# Patient Record
Sex: Female | Born: 2000 | Race: White | Hispanic: No | Marital: Single
Health system: Southern US, Community
[De-identification: ages and names within clinical notes are randomized; demographics above are authoritative.]

---
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
## 2022-01-27 ENCOUNTER — Telehealth: Payer: No Typology Code available for payment source | Admitting: Child and Adolescent Psychiatry

## 2022-01-27 ENCOUNTER — Telehealth: Payer: Self-pay | Admitting: Child and Adolescent Psychiatry

## 2022-01-27 NOTE — Telephone Encounter (Signed)
Pt was sent link via text and email to connect on video for telemedicine encounter for scheduled appointment, and was also followed up with phone call. Pt did not connect on the video, and writer left the VM requesting to connect on the video or call back to reschedule appointment if they are not able to connect today for appointment.   

## 2022-01-30 ENCOUNTER — Telehealth: Payer: No Typology Code available for payment source | Admitting: Child and Adolescent Psychiatry

## 2022-01-30 ENCOUNTER — Other Ambulatory Visit: Payer: Self-pay

## 2022-01-30 ENCOUNTER — Telehealth: Payer: Self-pay | Admitting: Child and Adolescent Psychiatry

## 2022-01-30 NOTE — Telephone Encounter (Signed)
Pt was sent link via text and email to connect on video for telemedicine encounter for scheduled appointment, and was also followed up with phone call. Pt did not connect on the video, and writer left the VM requesting to connect on the video or call back to reschedule appointment if they are not able to connect today for appointment.   

## 2022-01-31 ENCOUNTER — Telehealth (HOSPITAL_COMMUNITY): Payer: Self-pay | Admitting: Child and Adolescent Psychiatry

## 2022-01-31 ENCOUNTER — Encounter: Payer: Self-pay | Admitting: Child and Adolescent Psychiatry

## 2022-01-31 ENCOUNTER — Telehealth (INDEPENDENT_AMBULATORY_CARE_PROVIDER_SITE_OTHER): Payer: No Typology Code available for payment source | Admitting: Child and Adolescent Psychiatry

## 2022-01-31 ENCOUNTER — Other Ambulatory Visit: Payer: Self-pay

## 2022-01-31 DIAGNOSIS — F418 Other specified anxiety disorders: Secondary | ICD-10-CM | POA: Diagnosis not present

## 2022-01-31 MED ORDER — ESCITALOPRAM OXALATE 5 MG PO TABS: 7.5000 mg | ORAL_TABLET | Freq: Every day | ORAL | 2 refills | Status: DC

## 2022-01-31 NOTE — Progress Notes (Signed)
?Virtual Visit via Video Note ? ?I connected with Ellen Byrd on 01/31/22  at  1:30 PM EDT by a video enabled telemedicine application and verified that I am speaking with the correct person using two identifiers. ? ?Location: ?Patient: home ?Provider: office ?  ?I discussed the limitations of evaluation and management by telemedicine and the availability of in person appointments. The patient expressed understanding and agreed to proceed. ? ?  ?I discussed the assessment and treatment plan with the patient. The patient was provided an opportunity to ask questions and all were answered. The patient agreed with the plan and demonstrated an understanding of the instructions. ?  ?The patient was advised to call back or seek an in-person evaluation if the symptoms worsen or if the condition fails to improve as anticipated. ? ? ? ?Ellen SmallingHiren M Yalexa Blust, MD ? ? ?[facility] MD/PA/NP OP Progress Note ?01/31/22  1:30PM ?Ellen Byrd  ?MRN:  [id number] ? ?Chief Complaint: Follow-up for anxiety. ? ?HPI: This is an 21 year old female, domiciled in between parents, freshman at [employer] with no significant medical history and psychiatric history of anxiety was seen and evaluated over telemedicine encounter for follow-up.  ? ?[name] reports that she has noticed increasing anxiety in the context of finding out that she may need another surgery on her knee to remove a bony spur that is causing pain in her knee.  She reports that because of her surgery last year, she underwent a lot of trauma/anxiety/pain related to surgery and does not want to do the surgery again however she also does not want to stay with the pain as well.  She reports that she has an appointment with her orthopedics doctor on Friday and will find out if they will recommend surgery or she should just continue with brace.  Provided refelctive and empathic listening, and validated patient's experience. Allowed her to explore her feelings related to surgery and validated  them. Discussed that overthinking about the unknown will result in more anxiety and she does not have control over it. Explored the ways to stay at present and try grounding techinques. She reports that she usually distracts her self and that helps.  I also offered her to increase the dose of Lexapro to 10 mg once a day however she would like to wait until the appointment.  Discussed that she seems to be going through an adjustment reaction and if her anxiety remains high than we can temporarily increase the dose of her Lexapro until her surgery and once she completes her surgery and her anxiety improves then we can reduce the dose back to 7.5 mg.  She reports that she will think about this and will let me know. ? ?She reports occasional sadness in the context of recent stressors, denies any persistent sad mood or anhedonia.  She denies any SI/HI.  She reports that she has been eating well, doing well academically, and on the track to finish her college graduation 1-1/2-year prior to an actual graduation date. ? ?We discussed to continue with current medications for now.  She will follow back again in 4 weeks or earlier if needed. ? ?Visit Diagnosis:  ?  ICD-10-CM   ?1. Other specified anxiety disorders  F41.8 escitalopram (LEXAPRO) 5 MG tablet  ?  ? ? ? ? ?Past Psychiatric History:  ? ?  ?Inpatient: None ?RTC: None ?Outpatient:  ?   - Meds: None ?   - Therapy: Ellen EaringAllen Byrd since 6th grade and was seeing every 2  weeks. Therapist recently retired and [name] does not feel the need to continue with therapy.  ?Hx of SI/HI: None reported. ? ?Past Medical History: No past medical history on file. No past surgical history on file. ? ?Family Psychiatric History: ? ?  ?Mother - Anxiety ?No other family psychiatric hx reported.  ? ?Family History: No family history on file. ? ?Social History:  ?Social History  ? ?Socioeconomic History  ? Marital status: Single  ?  Spouse name: Not on file  ? Number of children: Not on file  ?  Years of education: Not on file  ? Highest education level: Not on file  ?Occupational History  ? Not on file  ?Tobacco Use  ? Smoking status: Not on file  ? Smokeless tobacco: Not on file  ?Substance and Sexual Activity  ? Alcohol use: Not on file  ? Drug use: Not on file  ? Sexual activity: Not on file  ?Other Topics Concern  ? Not on file  ?Social History Narrative  ? Not on file  ? ?Social Determinants of Health  ? ?Financial Resource Strain: Not on file  ?Food Insecurity: Not on file  ?Transportation Needs: Not on file  ?Physical Activity: Not on file  ?Stress: Not on file  ?Social Connections: Not on file  ? ? ?Allergies: No Known Allergies ? ?Metabolic Disorder Labs: ?No results found for: HGBA1C, MPG ?No results found for: PROLACTIN ?No results found for: CHOL, TRIG, HDL, CHOLHDL, VLDL, LDLCALC ?No results found for: TSH ? ?Therapeutic Level Labs: ?No results found for: LITHIUM ?No results found for: VALPROATE ?No components found for:  CBMZ ? ?Current Medications: ?Current Outpatient Medications  ?Medication Sig Dispense Refill  ? escitalopram (LEXAPRO) 5 MG tablet Take 1.5 tablets (7.5 mg total) by mouth daily. 45 tablet 2  ? hydrOXYzine (ATARAX/VISTARIL) 25 MG tablet Take 0.5-1 tablets (12.5-25 mg total) by mouth 3 (three) times daily as needed for anxiety. 30 tablet 0  ? ?No current facility-administered medications for this visit.  ? ? ? ?Musculoskeletal: ?Strength & Muscle Tone: unable to assess since visit was over the telemedicine. ?Gait & Station: unable to assess since visit was over the telemedicine. ?Patient leans: N/A ? ?Psychiatric Specialty Exam: ?Review of Systems  ?There were no vitals taken for this visit.There is no height or weight on file to calculate BMI.  ? ? ?Mental Status Exam: ?Appearance: casually dressed; well groomed; no overt signs of trauma or distress noted ?Attitude: calm, cooperative with good eye contact ?Activity: No PMA/PMR, no tics/no tremors; no EPS noted  ?Speech:  normal rate, rhythm and volume ?Thought Process: Logical, linear, and goal-directed.  ?Associations: no looseness, tangentiality, circumstantiality, flight of ideas, thought blocking or word salad noted ?Thought Content: (abnormal/psychotic thoughts): no abnormal or delusional thought process evidenced ?SI/HI: denies Si/Hi ?Perception: no illusions or visual/auditory hallucinations noted; no response to internal stimuli demonstrated ?Mood & Affect: "ok"/full range,restricted ?Judgment & Insight: both fair ?Attention and Concentration : Good ?Cognition : WNL ?Language : Good ?ADL - Intact  ? ?Screenings: ? ? ?Assessment and Plan:  ? 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 Her anxiety appears to have worsened in the context of new stressor. She would like to continue with current medications. Will continue to monitor, and asked front dest to make a therapy appointment to [facility] office.  ? ? ?  ?Plan: ?  ?- Reviewed response to current medications ?- Continue  Lexapro 7.5 mg daily, pt declines to increase at this time.  ?- Continue atarax 12.5-25 mg q8hrs as needed for anxiety ?- Side effects including but not limited to nausea, vomiting, diarrhea, constipation, headaches, dizziness, black box warning of suicidal thoughts with SSRI were discussed with pt and parents at the initiation.  ? ?Supportive counseling provided as mentioned in HPI, Duration - 20 minutes.  ? ? ? ? ? ?Ellen Smalling, MD ?01/31/22, 10:00AM ?

## 2022-03-07 ENCOUNTER — Telehealth (INDEPENDENT_AMBULATORY_CARE_PROVIDER_SITE_OTHER): Payer: No Typology Code available for payment source | Admitting: Child and Adolescent Psychiatry

## 2022-03-07 DIAGNOSIS — F411 Generalized anxiety disorder: Secondary | ICD-10-CM

## 2022-03-07 NOTE — Progress Notes (Signed)
?Virtual Visit via Video Note ? ?I connected with Ellen Byrd on 03/07/22  at  1:30 PM EDT by a video enabled telemedicine application and verified that I am speaking with the correct person using two identifiers. ? ?Location: ?Patient: home ?Provider: office ?  ?I discussed the limitations of evaluation and management by telemedicine and the availability of in person appointments. The patient expressed understanding and agreed to proceed. ? ?  ?I discussed the assessment and treatment plan with the patient. The patient was provided an opportunity to ask questions and all were answered. The patient agreed with the plan and demonstrated an understanding of the instructions. ?  ?The patient was advised to call back or seek an in-person evaluation if the symptoms worsen or if the condition fails to improve as anticipated. ? ? ? ?Ellen Smalling, MD ? ? ?BH MD/PA/NP OP Progress Note ?03/07/22  1:30PM ?Ellen Byrd  ?MRN:  [id number] ? ?Chief Complaint: Follow-up for anxiety. ? ?HPI: This is an 21 year old female, domiciled in between parents, freshman at [employer] with no significant medical history and psychiatric history of anxiety was seen and evaluated over telemedicine encounter for follow-up.  ? ?[name] reports that she is doing "okay".  She reports that she continues to have multiple psychosocial stressors including finals next week, moving to a different home near her college, her father moving to [place], mother with her own problems and threat of 2nd surgery on her knee.  She reports that despite the stressors she is trying to manage her anxiety.  Provided refelctive and empathic listening, and validated patient's experience. Supportive counseling was provided. She reports that she is doing okay managing it.  She reports that despite all the stress that she has been sleeping very well and sleep has been restful.  She reports that her anxiety seems manageable and would like to continue with current  medications. ? ?We discussed to continue with current medications for now.  She will follow back again in 5-6weeks or earlier if needed. ? ?Visit Diagnosis:  ?  ICD-10-CM   ?1. Generalized anxiety disorder  F41.1   ?  ? ? ? ? ?Past Psychiatric History:  ? ?  ?Inpatient: None ?RTC: None ?Outpatient:  ?   - Meds: None ?   - Therapy: Ellen Byrd since 6th grade and was seeing every 2 weeks. Therapist recently retired and Rowan does not feel the need to continue with therapy.  ?Hx of SI/HI: None reported. ? ?Past Medical History: No past medical history on file. No past surgical history on file. ? ?Family Psychiatric History: ? ?  ?Mother - Anxiety ?No other family psychiatric hx reported.  ? ?Family History: No family history on file. ? ?Social History:  ?Social History  ? ?Socioeconomic History  ? Marital status: Single  ?  Spouse name: Not on file  ? Number of children: Not on file  ? Years of education: Not on file  ? Highest education level: Not on file  ?Occupational History  ? Not on file  ?Tobacco Use  ? Smoking status: Not on file  ? Smokeless tobacco: Not on file  ?Substance and Sexual Activity  ? Alcohol use: Not on file  ? Drug use: Not on file  ? Sexual activity: Not on file  ?Other Topics Concern  ? Not on file  ?Social History Narrative  ? Not on file  ? ?Social Determinants of Health  ? ?Financial Resource Strain: Not on file  ?Food Insecurity: Not  on file  ?Transportation Needs: Not on file  ?Physical Activity: Not on file  ?Stress: Not on file  ?Social Connections: Not on file  ? ? ?Allergies: No Known Allergies ? ?Metabolic Disorder Labs: ?No results found for: HGBA1C, MPG ?No results found for: PROLACTIN ?No results found for: CHOL, TRIG, HDL, CHOLHDL, VLDL, LDLCALC ?No results found for: TSH ? ?Therapeutic Level Labs: ?No results found for: LITHIUM ?No results found for: VALPROATE ?No components found for:  CBMZ ? ?Current Medications: ?Current Outpatient Medications  ?Medication Sig Dispense Refill   ? escitalopram (LEXAPRO) 5 MG tablet Take 1.5 tablets (7.5 mg total) by mouth daily. 45 tablet 2  ? hydrOXYzine (ATARAX/VISTARIL) 25 MG tablet Take 0.5-1 tablets (12.5-25 mg total) by mouth 3 (three) times daily as needed for anxiety. 30 tablet 0  ? ?No current facility-administered medications for this visit.  ? ? ? ?Musculoskeletal: ?Strength & Muscle Tone: unable to assess since visit was over the telemedicine. ?Gait & Station: unable to assess since visit was over the telemedicine. ?Patient leans: N/A ? ?Psychiatric Specialty Exam: ?Review of Systems  ?There were no vitals taken for this visit.There is no height or weight on file to calculate BMI.  ? ?Mental Status Exam: ?Appearance: casually dressed; well groomed; no overt signs of trauma or distress noted ?Attitude: calm, cooperative with good eye contact ?Activity: No PMA/PMR, no tics/no tremors; no EPS noted  ?Speech: normal rate, rhythm and volume ?Thought Process: Logical, linear, and goal-directed.  ?Associations: no looseness, tangentiality, circumstantiality, flight of ideas, thought blocking or word salad noted ?Thought Content: (abnormal/psychotic thoughts): no abnormal or delusional thought process evidenced ?SI/HI: denies Si/Hi ?Perception: no illusions or visual/auditory hallucinations noted; no response to internal stimuli demonstrated ?Mood & Affect: "good"/full range, neutral ?Judgment & Insight: both fair ?Attention and Concentration : Good ?Cognition : WNL ?Language : Good ?ADL - Intact  ? ?Screenings: ? ? ?Assessment and Plan:  ? 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 Her anxiety appears to be manageable despite multiple psychosocial stressors. She would like to continue with current medications. Will continue to monitor. ? ? ?  ?Plan: ?  ?- Reviewed response to current medications ?- Continue Lexapro 7.5 mg daily, pt declines to  increase at this time.  ?- Continue atarax 12.5-25 mg q8hrs as needed for anxiety ?- Side effects including but not limited to nausea, vomiting, diarrhea, constipation, headaches, dizziness, black box warning of suicidal thoughts with SSRI were discussed with pt and parents at the initiation.  ? ? ? ? ? ? ?Ellen Smalling, MD ?03/07/22, 10:00AM ?

## 2022-04-12 ENCOUNTER — Telehealth (INDEPENDENT_AMBULATORY_CARE_PROVIDER_SITE_OTHER): Payer: No Typology Code available for payment source | Admitting: Child and Adolescent Psychiatry

## 2022-04-12 DIAGNOSIS — F411 Generalized anxiety disorder: Secondary | ICD-10-CM

## 2022-04-12 MED ORDER — ESCITALOPRAM OXALATE 5 MG PO TABS: 7.5000 mg | ORAL_TABLET | Freq: Every day | ORAL | 2 refills | Status: DC

## 2022-04-12 NOTE — Progress Notes (Signed)
Virtual Visit via Video Note  I connected with Ellen Byrd on 04/12/22  at  1:30 PM EDT by a video enabled telemedicine application and verified that I am speaking with the correct person using two identifiers.  Location: Patient: home Provider: office   I discussed the limitations of evaluation and management by telemedicine and the availability of in person appointments. The patient expressed understanding and agreed to proceed.    I discussed the assessment and treatment plan with the patient. The patient was provided an opportunity to ask questions and all were answered. The patient agreed with the plan and demonstrated an understanding of the instructions.   The patient was advised to call back or seek an in-person evaluation if the symptoms worsen or if the condition fails to improve as anticipated.    Orlene Erm, MD   [facility] MD/PA/NP OP Progress Note 04/12/22  1:30PM Ellen Byrd  MRN:  [id number]  Chief Complaint: Follow-up for anxiety.  HPI: This is a 21 year old female, domiciled in between parents, Sophomore at [employer] with no significant medical history and psychiatric history of anxiety was seen and evaluated over telemedicine encounter for follow-up.   Ellen Byrd reports that she has started summer semester for her college.  She reports that she is doing overall "okay".  She is currently living with her brother, they have been renting a house in [place], [place].  She reports that school has been going okay, she has a lot of work to finish but she is doing okay with it.  She reports that she remains stressed, anxious, rates her anxiety at 4 out of 10, 10 being most anxious, anxiety intermittently increases, about 3 weeks ago she had a panic attack in the context of interaction with her father.  She reports that she eventually removed her cell from the situation, went to her room and was able to calm down.  She reports her mood is occasionally sad as well as some  anhedonia.  She denies problems with sleep, energy, appetite or concentration.  She denies any SI or HI.  She scored 6 on GAD-7 and 4 on PHQ-9.  She reports that she has been compliant to her medications and denies any problems with it.  She does report that occasionally she forgets to take them.  We discussed option of increasing Lexapro to 10 mg however she would like to continue with current dose for now.  We discussed therapy recommendations, and provided her with the name of website on which she can such a therapist for herself.  She verbalized understanding.  She will follow back again in about 1 month or earlier if needed.   Visit Diagnosis:    ICD-10-CM   1. Generalized anxiety disorder  F41.1 escitalopram (LEXAPRO) 5 MG tablet        Past Psychiatric History:     Inpatient: None RTC: None Outpatient:     - Meds: None    - Therapy: [facility] since 6th grade and was seeing every 2 weeks. Therapist recently retired and Meeker does not feel the need to continue with therapy.  Hx of SI/HI: None reported.  Past Medical History: No past medical history on file. No past surgical history on file.  Family Psychiatric History:    Mother - Anxiety No other family psychiatric hx reported.   Family History: No family history on file.  Social History:  Social History   Socioeconomic History   Marital status: Single    Spouse  name: Not on file   Number of children: Not on file   Years of education: Not on file   Highest education level: Not on file  Occupational History   Not on file  Tobacco Use   Smoking status: Not on file   Smokeless tobacco: Not on file  Substance and Sexual Activity   Alcohol use: Not on file   Drug use: Not on file   Sexual activity: Not on file  Other Topics Concern   Not on file  Social History Narrative   Not on file   Social Determinants of Health   Financial Resource Strain: Not on file  Food Insecurity: Not on file  Transportation  Needs: Not on file  Physical Activity: Not on file  Stress: Not on file  Social Connections: Not on file    Allergies: No Known Allergies  Metabolic Disorder Labs: No results found for: HGBA1C, MPG No results found for: PROLACTIN No results found for: CHOL, TRIG, HDL, CHOLHDL, VLDL, LDLCALC No results found for: TSH  Therapeutic Level Labs: No results found for: LITHIUM No results found for: VALPROATE No components found for:  CBMZ  Current Medications: Current Outpatient Medications  Medication Sig Dispense Refill   escitalopram (LEXAPRO) 5 MG tablet Take 1.5 tablets (7.5 mg total) by mouth daily. 45 tablet 2   hydrOXYzine (ATARAX/VISTARIL) 25 MG tablet Take 0.5-1 tablets (12.5-25 mg total) by mouth 3 (three) times daily as needed for anxiety. 30 tablet 0   No current facility-administered medications for this visit.     Musculoskeletal: Strength & Muscle Tone: unable to assess since visit was over the telemedicine. Gait & Station: unable to assess since visit was over the telemedicine. Patient leans: N/A  Psychiatric Specialty Exam: Review of Systems  There were no vitals taken for this visit.There is no height or weight on file to calculate BMI.   Mental Status Exam: Appearance: casually dressed; well groomed; no overt signs of trauma or distress noted Attitude: calm, cooperative with good eye contact Activity: No PMA/PMR, no tics/no tremors; no EPS noted  Speech: normal rate, rhythm and volume Thought Process: Logical, linear, and goal-directed.  Associations: no looseness, tangentiality, circumstantiality, flight of ideas, thought blocking or word salad noted Thought Content: (abnormal/psychotic thoughts): no abnormal or delusional thought process evidenced SI/HI: denies Si/Hi Perception: no illusions or visual/auditory hallucinations noted; no response to internal stimuli demonstrated Mood & Affect: "good"/full range, neutral Judgment & Insight: both  fair Attention and Concentration : Good Cognition : WNL Language : Good ADL - Intact   Screenings: GAD-7    Flowsheet Row Video Visit from 04/12/2022 in [facility]  Total GAD-7 Score 6      PHQ2-9    Flowsheet Row Video Visit from 04/12/2022 in [facility]  PHQ-2 Total Score 2  PHQ-9 Total Score 4        Assessment and Plan:   21 year old female with 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 Her anxiety appears to be manageable despite multiple psychosocial stressors. She would like to continue with current medications and does not feel the need to increase the dose. Recommended ind therapy, she will search for therapist on [email or web address]. Will continue to monitor.     Plan:   - Reviewed response to current medications - Continue Lexapro 7.5 mg daily, pt declines to increase at this time.  - Continue atarax 12.5-25 mg q8hrs as needed for anxiety -  Side effects including but not limited to nausea, vomiting, diarrhea, constipation, headaches, dizziness, black box warning of suicidal thoughts with SSRI were discussed with pt and parents at the initiation.   30 minutes total time for encounter today which included chart review, pt evaluation, supportive counseling, medication and other treatment discussions, medication orders and charting.           Orlene Erm, MD 04/12/22, 10:00AM

## 2022-05-17 ENCOUNTER — Telehealth (INDEPENDENT_AMBULATORY_CARE_PROVIDER_SITE_OTHER): Payer: No Typology Code available for payment source | Admitting: Child and Adolescent Psychiatry

## 2022-05-17 DIAGNOSIS — F418 Other specified anxiety disorders: Secondary | ICD-10-CM | POA: Diagnosis not present

## 2022-05-17 NOTE — Progress Notes (Signed)
Virtual Visit via Video Note  I connected with Ellen Byrd on 05/17/22  at  1:30 PM EDT by a video enabled telemedicine application and verified that I am speaking with the correct person using two identifiers.  Location: Patient: home Provider: office   I discussed the limitations of evaluation and management by telemedicine and the availability of in person appointments. The patient expressed understanding and agreed to proceed.    I discussed the assessment and treatment plan with the patient. The patient was provided an opportunity to ask questions and all were answered. The patient agreed with the plan and demonstrated an understanding of the instructions.   The patient was advised to call back or seek an in-person evaluation if the symptoms worsen or if the condition fails to improve as anticipated.    Hiren M Umrania, MD   BH MD/PA/NP OP Progress Note 05/17/22  1:30PM Ellen Byrd  MRN:  [id number]  Chief Complaint: Medication management follow-up for anxiety.  HPI: This is a 21-year-old female, domiciled in between parents, Sophomore at [employer] with no significant medical history and psychiatric history of anxiety was seen and evaluated over telemedicine encounter for follow-up.  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  She reports that it gives her decent money however it can be hard to work in the heat but she has been adjusting to it.  She also has finished taking one summer class and has 1 more before she can have a break.  She reports that she will have about 2 to 2-1/2 weeks of break before she starts the fall semester and plans to work during the break.  She reports that she has been reading books, watching TV in her free time and also spends a lot of time with her brother.  She was recently at her parents home for Fourth of July weekend and had a good time, also met her boyfriend who is studying emergency medicine technician  course at [employer].  She reports that her anxiety is manageable, her mood is sometimes up and down in the context of adjustment to work.  She has been sleeping well, eating well, denies any SI or HI.  She reports that she has stayed compliant with Lexapro and will call back if she feels the need to increase the dose.  We discussed to continue with current treatment and she would like to follow back again in a month.  Visit Diagnosis:    ICD-10-CM   1. Other specified anxiety disorders  F41.8         Past Psychiatric History:     Inpatient: None RTC: None Outpatient:     - Meds: None    - Therapy: [facility] since 6th grade and was seeing every 2 weeks. Therapist recently retired and Ellen Byrd does not feel the need to continue with therapy.  Hx of SI/HI: None reported.  Past Medical History: No past medical history on file. No past surgical history on file.  Family Psychiatric History:    Mother - Anxiety No other family psychiatric hx reported.   Family History: No family history on file.  Social History:  Social History   Socioeconomic History   Marital status: Single    Spouse name: Not on file   Number of children: Not on file   Years of education: Not on file   Highest education level: Not on file  Occupational History   Not   on file  Tobacco Use   Smoking status: Not on file   Smokeless tobacco: Not on file  Substance and Sexual Activity   Alcohol use: Not on file   Drug use: Not on file   Sexual activity: Not on file  Other Topics Concern   Not on file  Social History Narrative   Not on file   Social Determinants of Health   Financial Resource Strain: Not on file  Food Insecurity: Not on file  Transportation Needs: Not on file  Physical Activity: Not on file  Stress: Not on file  Social Connections: Not on file    Allergies: No Known Allergies  Metabolic Disorder Labs: No results found for: "HGBA1C", "MPG" No results found for:  "PROLACTIN" No results found for: "CHOL", "TRIG", "HDL", "CHOLHDL", "VLDL", "LDLCALC" No results found for: "TSH"  Therapeutic Level Labs: No results found for: "LITHIUM" No results found for: "VALPROATE" No results found for: "CBMZ"  Current Medications: Current Outpatient Medications  Medication Sig Dispense Refill   escitalopram (LEXAPRO) 5 MG tablet Take 1.5 tablets (7.5 mg total) by mouth daily. 45 tablet 2   hydrOXYzine (ATARAX/VISTARIL) 25 MG tablet Take 0.5-1 tablets (12.5-25 mg total) by mouth 3 (three) times daily as needed for anxiety. 30 tablet 0   No current facility-administered medications for this visit.     Musculoskeletal: Strength & Muscle Tone: unable to assess since visit was over the telemedicine. Gait & Station: unable to assess since visit was over the telemedicine. Patient leans: N/A  Psychiatric Specialty Exam: Review of Systems  There were no vitals taken for this visit.There is no height or weight on file to calculate BMI.   Mental Status Exam: Appearance: casually dressed; well groomed; no overt signs of trauma or distress noted Attitude: calm, cooperative with good eye contact Activity: No PMA/PMR, no tics/no tremors; no EPS noted  Speech: normal rate, rhythm and volume Thought Process: Logical, linear, and goal-directed.  Associations: no looseness, tangentiality, circumstantiality, flight of ideas, thought blocking or word salad noted Thought Content: (abnormal/psychotic thoughts): no abnormal or delusional thought process evidenced SI/HI: denies Si/Hi Perception: no illusions or visual/auditory hallucinations noted; no response to internal stimuli demonstrated Mood & Affect: "good"/full range, neutral Judgment & Insight: both fair Attention and Concentration : Good Cognition : WNL Language : Good ADL - Intact   Screenings: GAD-7    Flowsheet Row Video Visit from 04/12/2022 in [facility]  Total GAD-7 Score 6       PHQ2-9    Flowsheet Row Video Visit from 04/12/2022 in [facility]  PHQ-2 Total Score 2  PHQ-9 Total Score 4        Assessment and Plan:   21-year-old female with 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  No new concerns expressed today, continues to appear to manage her anxiety well and would like to continue with current medications.       Plan:   - Reviewed response to current medications - Continue Lexapro 7.5 mg daily, pt declines to increase at this time.  - Continue atarax 12.5-25 mg q8hrs as needed for anxiety - Side effects including but not limited to nausea, vomiting, diarrhea, constipation, headaches, dizziness, black box warning of suicidal thoughts with SSRI were discussed with pt and parents at the initiation.   20 minutes total time for encounter today which included chart review, pt evaluation, supportive counseling, medication and other treatment discussions, medication   orders and charting.           Hiren M Umrania, MD 05/17/22, 10:00AM 

## 2022-06-22 ENCOUNTER — Telehealth: Payer: Self-pay | Admitting: Child and Adolescent Psychiatry

## 2022-06-22 ENCOUNTER — Telehealth: Payer: No Typology Code available for payment source | Admitting: Child and Adolescent Psychiatry

## 2022-06-22 NOTE — Telephone Encounter (Addendum)
Pt was sent link via text and email to connect on video for telemedicine encounter for scheduled appointment, and was also followed up with phone call. Pt did not connect on the video, and writer left the VM requesting to connect on the video or call back to reschedule appointment if they are not able to connect today for appointment.   

## 2022-07-14 ENCOUNTER — Telehealth (INDEPENDENT_AMBULATORY_CARE_PROVIDER_SITE_OTHER): Payer: No Typology Code available for payment source | Admitting: Child and Adolescent Psychiatry

## 2022-07-14 DIAGNOSIS — F411 Generalized anxiety disorder: Secondary | ICD-10-CM

## 2022-07-14 MED ORDER — ESCITALOPRAM OXALATE 5 MG PO TABS: 7.5000 mg | ORAL_TABLET | Freq: Every day | ORAL | 2 refills | Status: DC

## 2022-07-14 NOTE — Progress Notes (Signed)
Virtual Visit via Video Note  I connected with Ellen Byrd on 07/14/22  at 10:00 AM EDT by a video enabled telemedicine application and verified that I am speaking with the correct person using two identifiers.  Location: Patient: home Provider: office   I discussed the limitations of evaluation and management by telemedicine and the availability of in person appointments. The patient expressed understanding and agreed to proceed.    I discussed the assessment and treatment plan with the patient. The patient was provided an opportunity to ask questions and all were answered. The patient agreed with the plan and demonstrated an understanding of the instructions.   The patient was advised to call back or seek an in-person evaluation if the symptoms worsen or if the condition fails to improve as anticipated.    Ellen Smalling, MD   [facility] MD/PA/NP OP Progress Note 07/14/22  1:30PM Ellen Byrd  MRN:  [id number]  Chief Complaint: Medication management follow-up for anxiety.  HPI: This is a 21 year old female, domiciled in between parents, Ellen Byrd with no significant medical history and psychiatric history of anxiety was seen and evaluated over telemedicine encounter for follow-up.  Ellen Byrd reports that she started her fall semester recently, continues to work about 32 hours a week and with all this she gets very tired.  She reports that she sleeps about 8-1/2 hours every day but despite that she has a lot of tiredness because of the work.  She reports that she has occasionally sad mood but denies any long periods of depressed mood and anxiety also situationally increases.  She reports that she is currently on a good dose on the medication and would like to continue with the same.  She reports that she has been eating well, denies any SI or HI.  She talked about her stressors of school, work. Provided refelctive and empathic listening, and validated patient's experience.  She  will follow back again in about 6 weeks or earlier if needed.  Visit Diagnosis:    ICD-10-CM   1. Generalized anxiety disorder  F41.1 escitalopram (LEXAPRO) 5 MG tablet        Past Psychiatric History:     Inpatient: None RTC: None Outpatient:     - Meds: None    - Therapy: Ellen Byrd since 6th grade and was seeing every 2 weeks. Therapist recently retired and Ellen Byrd does not feel the need to continue with therapy.  Hx of SI/HI: None reported.  Past Medical History: No past medical history on file. No past surgical history on file.  Family Psychiatric History:    Mother - Anxiety No other family psychiatric hx reported.   Family History: No family history on file.  Social History:  Social History   Socioeconomic History   Marital status: Single    Spouse name: Not on file   Number of children: Not on file   Years of education: Not on file   Highest education level: Not on file  Occupational History   Not on file  Tobacco Use   Smoking status: Not on file   Smokeless tobacco: Not on file  Substance and Sexual Activity   Alcohol use: Not on file   Drug use: Not on file   Sexual activity: Not on file  Other Topics Concern   Not on file  Social History Narrative   Not on file   Social Determinants of Health   Financial Resource Strain: Not on file  Food Insecurity: Not  on file  Transportation Needs: Not on file  Physical Activity: Not on file  Stress: Not on file  Social Connections: Not on file    Allergies: No Known Allergies  Metabolic Disorder Labs: No results found for: "HGBA1C", "MPG" No results found for: "PROLACTIN" No results found for: "CHOL", "TRIG", "HDL", "CHOLHDL", "VLDL", "LDLCALC" No results found for: "TSH"  Therapeutic Level Labs: No results found for: "LITHIUM" No results found for: "VALPROATE" No results found for: "CBMZ"  Current Medications: Current Outpatient Medications  Medication Sig Dispense Refill   escitalopram  (LEXAPRO) 5 MG tablet Take 1.5 tablets (7.5 mg total) by mouth daily. 45 tablet 2   hydrOXYzine (ATARAX/VISTARIL) 25 MG tablet Take 0.5-1 tablets (12.5-25 mg total) by mouth 3 (three) times daily as needed for anxiety. 30 tablet 0   No current facility-administered medications for this visit.     Musculoskeletal: Strength & Muscle Tone: unable to assess since visit was over the telemedicine. Gait & Station: unable to assess since visit was over the telemedicine. Patient leans: N/A  Psychiatric Specialty Exam: Review of Systems  There were no vitals taken for this visit.There is no Byrd or weight on file to calculate BMI.   Mental Status Exam: Appearance: casually dressed; well groomed; no overt signs of trauma or distress noted Attitude: calm, cooperative with good eye contact Activity: No PMA/PMR, no tics/no tremors; no EPS noted  Speech: normal rate, rhythm and volume Thought Process: Logical, linear, and goal-directed.  Associations: no looseness, tangentiality, circumstantiality, flight of ideas, thought blocking or word salad noted Thought Content: (abnormal/psychotic thoughts): no abnormal or delusional thought process evidenced SI/HI: denies Si/Hi Perception: no illusions or visual/auditory hallucinations noted; no response to internal stimuli demonstrated Mood & Affect: "good"/full range, neutral Judgment & Insight: both fair Attention and Concentration : Good Cognition : WNL Language : Good ADL - Intact   Screenings: GAD-7    Flowsheet Row Video Visit from [facility]/12/2022 in [facility]  Total GAD-7 Score 6      PHQ2-9    Flowsheet Row Video Visit from 04/12/2022 in [facility]  PHQ-2 Total Score 2  PHQ-9 Total Score 4        Assessment and Plan:   21 year old female with 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  She appears to be  managing her anxiety well, and anxiety appears to be stable.  Plan as mentioned below.        Plan:   - Reviewed response to current medications - Continue Lexapro 7.5 mg daily, pt declines to increase at this time.  - Continue atarax 12.5-25 mg q8hrs as needed for anxiety - Side effects including but not limited to nausea, vomiting, diarrhea, constipation, headaches, dizziness, black box warning of suicidal thoughts with SSRI were discussed with pt and parents at the initiation.   20 minutes total time for encounter today which included chart review, pt evaluation, supportive counseling, medication and other treatment discussions, medication orders and charting.           Ellen Smalling, MD 07/14/22, 10:00AM

## 2022-08-22 ENCOUNTER — Telehealth (INDEPENDENT_AMBULATORY_CARE_PROVIDER_SITE_OTHER): Payer: No Typology Code available for payment source | Admitting: Child and Adolescent Psychiatry

## 2022-08-22 DIAGNOSIS — F411 Generalized anxiety disorder: Secondary | ICD-10-CM | POA: Diagnosis not present

## 2022-08-22 NOTE — Progress Notes (Signed)
Virtual Visit via Video Note  I connected with Ellen Byrd on 08/22/22  at 10:30 AM EDT by a video enabled telemedicine application and verified that I am speaking with the correct person using two identifiers.  Location: Patient: home Provider: office   I discussed the limitations of evaluation and management by telemedicine and the availability of in person appointments. The patient expressed understanding and agreed to proceed.    I discussed the assessment and treatment plan with the patient. The patient was provided an opportunity to ask questions and all were answered. The patient agreed with the plan and demonstrated an understanding of the instructions.   The patient was advised to call back or seek an in-person evaluation if the symptoms worsen or if the condition fails to improve as anticipated.    Orlene Erm, MD   [facility] MD/PA/NP OP Progress Note 08/22/22  10:30AM Ellen Byrd  MRN:  [id number]  Chief Complaint: Follow-up for anxiety.  HPI: This is a 21 year old female, domiciled in between parents, currently at [facility] with no significant medical history and psychiatric history of anxiety was seen and evaluated over telemedicine encounter for follow-up.  Ellen Byrd reports that she has multiple psychosocial stressors in her life at present.  She reports that a family friend died of pancreatic cancer about 2 weeks ago, her grandparents as well as other relatives live in [place] and due to current or she has been worried about their safety.  She reports that her anxiety has increased over the last 2 weeks and she had considered increasing the dose of Lexapro but because of a lot of situational stressors she wants to hold off and see how she does with her anxiety.  She reports that she has been able to manage her stressors by herself as well as with the support from her friends.  She also reports that her anxiety usually increases right before her menstrual cycle and  improves afterwards.  She reports that she is doing fairly well with her schoolwork, fell behind but was able to catch up during the fall break.  Her mood is stressed but denies any low lows.  She denies any SI or HI, sleeps well, has decent energy and eating well.  She continues to work as well.  She would like to continue with Lexapro 7.5 mg and will consider increasing the dose if need to.  She would like to follow-up again in a month.  Visit Diagnosis:    ICD-10-CM   1. Generalized anxiety disorder  F41.1         Past Psychiatric History:     Inpatient: None RTC: None Outpatient:     - Meds: None    - Therapy: [facility] since 6th grade and was seeing every 2 weeks. Therapist recently retired and [facility] does not feel the need to continue with therapy.  Hx of SI/HI: None reported.  Past Medical History: No past medical history on file. No past surgical history on file.  Family Psychiatric History:    Mother - Anxiety No other family psychiatric hx reported.   Family History: No family history on file.  Social History:  Social History   Socioeconomic History   Marital status: Single    Spouse name: Not on file   Number of children: Not on file   Years of education: Not on file   Highest education level: Not on file  Occupational History   Not on file  Tobacco Use   Smoking  status: Not on file   Smokeless tobacco: Not on file  Substance and Sexual Activity   Alcohol use: Not on file   Drug use: Not on file   Sexual activity: Not on file  Other Topics Concern   Not on file  Social History Narrative   Not on file   Social Determinants of Health   Financial Resource Strain: Not on file  Food Insecurity: Not on file  Transportation Needs: Not on file  Physical Activity: Not on file  Stress: Not on file  Social Connections: Not on file    Allergies: No Known Allergies  Metabolic Disorder Labs: No results found for: "HGBA1C", "MPG" No results found for:  "PROLACTIN" No results found for: "CHOL", "TRIG", "HDL", "CHOLHDL", "VLDL", "LDLCALC" No results found for: "TSH"  Therapeutic Level Labs: No results found for: "LITHIUM" No results found for: "VALPROATE" No results found for: "CBMZ"  Current Medications: Current Outpatient Medications  Medication Sig Dispense Refill   escitalopram (LEXAPRO) 5 MG tablet Take 1.5 tablets (7.5 mg total) by mouth daily. 45 tablet 2   hydrOXYzine (ATARAX/VISTARIL) 25 MG tablet Take 0.5-1 tablets (12.5-25 mg total) by mouth 3 (three) times daily as needed for anxiety. 30 tablet 0   No current facility-administered medications for this visit.     Musculoskeletal: Strength & Muscle Tone: unable to assess since visit was over the telemedicine. Gait & Station: unable to assess since visit was over the telemedicine. Patient leans: N/A  Psychiatric Specialty Exam: Review of Systems  There were no vitals taken for this visit.There is no height or weight on file to calculate BMI.   Mental Status Exam: Appearance: casually dressed; well groomed; no overt signs of trauma or distress noted Attitude: calm, cooperative with good eye contact Activity: No PMA/PMR, no tics/no tremors; no EPS noted  Speech: normal rate, rhythm and volume Thought Process: Logical, linear, and goal-directed.  Associations: no looseness, tangentiality, circumstantiality, flight of ideas, thought blocking or word salad noted Thought Content: (abnormal/psychotic thoughts): no abnormal or delusional thought process evidenced SI/HI: denies Si/Hi Perception: no illusions or visual/auditory hallucinations noted; no response to internal stimuli demonstrated Mood & Affect: "good"/full range, neutral Judgment & Insight: both fair Attention and Concentration : Good Cognition : WNL Language : Good ADL - Intact   Screenings: GAD-7    Flowsheet Row Video Visit from 04/12/2022 in [facility]  Total GAD-7 Score  6      PHQ2-9    Flowsheet Row Video Visit from 04/12/2022 in [facility]  PHQ-2 Total Score 2  PHQ-9 Total Score 4        Assessment and Plan:   21 year old female with 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  She appears to be managing her anxiety well despite multiple psychosocial stressors and would like to continue with current dose of Lexapro. Plan as mentioned below.        Plan:   - Reviewed response to current medications - Continue Lexapro 7.5 mg daily, pt declines to increase at this time.  - Continue atarax 12.5-25 mg q8hrs as needed for anxiety - Side effects including but not limited to nausea, vomiting, diarrhea, constipation, headaches, dizziness, black box warning of suicidal thoughts with SSRI were discussed with pt and parents at the initiation.   30 minutes total time for encounter today which included chart review, pt evaluation, supportive counseling, medication and other treatment discussions, medication  orders and charting.           [name], MD 08/22/22, 10:00AM

## 2022-09-21 ENCOUNTER — Telehealth (INDEPENDENT_AMBULATORY_CARE_PROVIDER_SITE_OTHER): Payer: No Typology Code available for payment source | Admitting: Child and Adolescent Psychiatry

## 2022-09-21 DIAGNOSIS — F418 Other specified anxiety disorders: Secondary | ICD-10-CM

## 2022-09-21 NOTE — Progress Notes (Addendum)
Virtual Visit via Video Note  I connected with Ellen Byrd on 09/21/22  at  1:00 PM EST by a video enabled telemedicine application and verified that I am speaking with the correct person using two identifiers.  Location: Patient: home Provider: office   I discussed the limitations of evaluation and management by telemedicine and the availability of in person appointments. The patient expressed understanding and agreed to proceed.    I discussed the assessment and treatment plan with the patient. The patient was provided an opportunity to ask questions and all were answered. The patient agreed with the plan and demonstrated an understanding of the instructions.   The patient was advised to call back or seek an in-person evaluation if the symptoms worsen or if the condition fails to improve as anticipated.    [name], MD   [facility] MD/PA/NP OP Progress Note 09/21/22  10:30AM Ellen Byrd  MRN:  [id number]  Chief Complaint: Follow-up for anxiety.  HPI: This is a 21 year old female, domiciled in between parents, currently at [facility] with no significant medical history and psychiatric history of anxiety was seen and evaluated over telemedicine encounter for follow-up.  Cloma continues to report multiple psychosocial stressors in her life at present.  She reports that she is finishing her semester, has lots of school work to do and has finals exam on top of it.  She however is staying on top of her assignments.  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  Additionally she reports that she does not feel safe on the campus because of recent anti-Israel protests and anti-Semitism on the college campuses.  She reports that in college there has been huge protests against the war and she feels someone might actually hurt her.  She reports that she is taking safety precautions because of this. Provided refelctive and empathic  listening, and validated patient's experience. She is planning to report her concerns to the college's dean. We discussed to continue to focus on her goals for finishing the college and attempt to not focus on things she may not have control over. She was receptive to this.   She however does not feel that she is anxious.  She reports that she is generally sad because of the situation but denies low lows or depressive episodes.  She is still sleeping about 8 hours at night, denies problems with appetite, and she does not have any thoughts of hurting herself or others.  She would like to continue with the current dose of medication.  Visit Diagnosis:    ICD-10-CM   1. Other specified anxiety disorders  F41.8          Past Psychiatric History:     Inpatient: None RTC: None Outpatient:     - Meds: None    - Therapy: Ellen Byrd since 6th grade and was seeing every 2 weeks. Therapist recently retired and Ellen Byrd does not feel the need to continue with therapy.  Hx of SI/HI: None reported.  Past Medical History: No past medical history on file. No past surgical history on file.  Family Psychiatric History:    Mother - Anxiety No other family psychiatric hx reported.   Family History: No family history on file.  Social History:  Social History   Socioeconomic History   Marital status: Single    Spouse name: Not on file   Number of children: Not on file  Years of education: Not on file   Highest education level: Not on file  Occupational History   Not on file  Tobacco Use   Smoking status: Not on file   Smokeless tobacco: Not on file  Substance and Sexual Activity   Alcohol use: Not on file   Drug use: Not on file   Sexual activity: Not on file  Other Topics Concern   Not on file  Social History Narrative   Not on file   Social Determinants of Health   Financial Resource Strain: Not on file  Food Insecurity: Not on file  Transportation Needs: Not on file  Physical  Activity: Not on file  Stress: Not on file  Social Connections: Not on file    Allergies: No Known Allergies  Metabolic Disorder Labs: No results found for: "HGBA1C", "MPG" No results found for: "PROLACTIN" No results found for: "CHOL", "TRIG", "HDL", "CHOLHDL", "VLDL", "LDLCALC" No results found for: "TSH"  Therapeutic Level Labs: No results found for: "LITHIUM" No results found for: "VALPROATE" No results found for: "CBMZ"  Current Medications: Current Outpatient Medications  Medication Sig Dispense Refill   escitalopram (LEXAPRO) 5 MG tablet Take 1.5 tablets (7.5 mg total) by mouth daily. 45 tablet 2   hydrOXYzine (ATARAX/VISTARIL) 25 MG tablet Take 0.5-1 tablets (12.5-25 mg total) by mouth 3 (three) times daily as needed for anxiety. 30 tablet 0   No current facility-administered medications for this visit.     Musculoskeletal: Strength & Muscle Tone: unable to assess since visit was over the telemedicine. Gait & Station: unable to assess since visit was over the telemedicine. Patient leans: N/A  Psychiatric Specialty Exam: Review of Systems  There were no vitals taken for this visit.There is no height or weight on file to calculate BMI.   Mental Status Exam: Appearance: casually dressed; well groomed; no overt signs of trauma or distress noted Attitude: calm, cooperative with good eye contact Activity: No PMA/PMR, no tics/no tremors; no EPS noted  Speech: normal rate, rhythm and volume Thought Process: Logical, linear, and goal-directed.  Associations: no looseness, tangentiality, circumstantiality, flight of ideas, thought blocking or word salad noted Thought Content: (abnormal/psychotic thoughts): no abnormal or delusional thought process evidenced SI/HI: denies Si/Hi Perception: no illusions or visual/auditory hallucinations noted; no response to internal stimuli demonstrated Mood & Affect: "stressed"/restricted Judgment & Insight: both fair Attention and  Concentration : Good Cognition : WNL Language : Good ADL - Intact    Screenings: GAD-7    Flowsheet Row Video Visit from 04/12/2022 in [facility]  Total GAD-7 Score 6      PHQ2-9    Flowsheet Row Video Visit from 04/12/2022 in [place]  PHQ-2 Total Score 2  PHQ-9 Total Score 4        Assessment and Plan:   21 year old female with 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  She does continue to experience multiple psychosocial stressors however continues to manage her anxiety well.  Would like to continue with current medications and therefore we mutually decided to not change the medication plan.  I referring her for individual psychotherapy with a new [facility] therapist.      Plan:   - Reviewed response to current medications - Continue Lexapro 7.5 mg daily, pt declines to increase at this time.  - Continue atarax 12.5-25 mg q8hrs as needed for anxiety - Side effects including but not limited to nausea,  vomiting, diarrhea, constipation, headaches, dizziness, black box warning of suicidal thoughts with SSRI were discussed with pt and parents at the initiation.   30 minutes total time for encounter today which included chart review, pt evaluation, supportive counseling, medication and other treatment discussions, medication orders and charting.           Orlene Erm, MD 09/21/22, 10:00AM

## 2022-10-27 ENCOUNTER — Telehealth (INDEPENDENT_AMBULATORY_CARE_PROVIDER_SITE_OTHER): Payer: No Typology Code available for payment source | Admitting: Child and Adolescent Psychiatry

## 2022-10-27 DIAGNOSIS — F411 Generalized anxiety disorder: Secondary | ICD-10-CM

## 2022-10-27 NOTE — Progress Notes (Signed)
Virtual Visit via Video Note  I connected with Ellen Byrd on 10/27/22  at 10:30 AM EST by a video enabled telemedicine application and verified that I am speaking with the correct person using two identifiers.  Location: Patient: home Provider: office   I discussed the limitations of evaluation and management by telemedicine and the availability of in person appointments. The patient expressed understanding and agreed to proceed.    I discussed the assessment and treatment plan with the patient. The patient was provided an opportunity to ask questions and all were answered. The patient agreed with the plan and demonstrated an understanding of the instructions.   The patient was advised to call back or seek an in-person evaluation if the symptoms worsen or if the condition fails to improve as anticipated.    Ellen Smalling, MD   [facility] MD/PA/NP OP Progress Note 10/27/22  10:30AM Ellen Byrd  MRN:  [id number]  Chief Complaint: Medication management follow-up for anxiety.  HPI: This is a 21 year old female, domiciled in between parents, currently at [facility] with no significant medical history and psychiatric history of anxiety was seen and evaluated over telemedicine encounter for follow-up.  Ellen Byrd says that she is doing better as compared to last appointment.  She had a lot of stress related to end of semester exams and all the assignments however she has finished, made good grades, made on Dean's letter and since beginning of this week on a Christmas break for the next 1 month.  She is back at home and feels more relaxed, not anxious, mood is happy, sleeping better, and overall doing well.  She says that her anxiety is manageable, and she has continued to take her medications as prescribed.  She denies any SI or HI.  Because of the stability in her symptoms we discussed to continue with Lexapro 7.5 mg daily.  She has not needed to take hydroxyzine recently.  She has not been in  therapy but she would like to see a therapist to manage her ongoing psychosocial stressors.  We discussed to have her put on a wait list for a new therapist starting in January.  She verbalized understanding and agreed with this plan.  Visit Diagnosis:    ICD-10-CM   1. Generalized anxiety disorder  F41.1           Past Psychiatric History:     Inpatient: None RTC: None Outpatient:     - Meds: None    - Therapy: Ellen Byrd since 6th grade and was seeing every 2 weeks. Therapist recently retired and Ellen Byrd does not feel the need to continue with therapy.  Hx of SI/HI: None reported.  Past Medical History: No past medical history on file. No past surgical history on file.  Family Psychiatric History:    Mother - Anxiety No other family psychiatric hx reported.   Family History: No family history on file.  Social History:  Social History   Socioeconomic History   Marital status: Single    Spouse name: Not on file   Number of children: Not on file   Years of education: Not on file   Highest education level: Not on file  Occupational History   Not on file  Tobacco Use   Smoking status: Not on file   Smokeless tobacco: Not on file  Substance and Sexual Activity   Alcohol use: Not on file   Drug use: Not on file   Sexual activity: Not on file  Other  Topics Concern   Not on file  Social History Narrative   Not on file   Social Determinants of Health   Financial Resource Strain: Not on file  Food Insecurity: Not on file  Transportation Needs: Not on file  Physical Activity: Not on file  Stress: Not on file  Social Connections: Not on file    Allergies: No Known Allergies  Metabolic Disorder Labs: No results found for: "HGBA1C", "MPG" No results found for: "PROLACTIN" No results found for: "CHOL", "TRIG", "HDL", "CHOLHDL", "VLDL", "LDLCALC" No results found for: "TSH"  Therapeutic Level Labs: No results found for: "LITHIUM" No results found for:  "VALPROATE" No results found for: "CBMZ"  Current Medications: Current Outpatient Medications  Medication Sig Dispense Refill   escitalopram (LEXAPRO) 5 MG tablet Take 1.5 tablets (7.5 mg total) by mouth daily. 45 tablet 2   hydrOXYzine (ATARAX/VISTARIL) 25 MG tablet Take 0.5-1 tablets (12.5-25 mg total) by mouth 3 (three) times daily as needed for anxiety. 30 tablet 0   No current facility-administered medications for this visit.     Musculoskeletal: Strength & Muscle Tone: unable to assess since visit was over the telemedicine. Gait & Station: unable to assess since visit was over the telemedicine. Patient leans: N/A  Psychiatric Specialty Exam: Review of Systems  There were no vitals taken for this visit.There is no height or weight on file to calculate BMI.   Mental Status Exam: Appearance: casually dressed; well groomed; no overt signs of trauma or distress noted Attitude: calm, cooperative with good eye contact Activity: No PMA/PMR, no tics/no tremors; no EPS noted  Speech: normal rate, rhythm and volume Thought Process: Logical, linear, and goal-directed.  Associations: no looseness, tangentiality, circumstantiality, flight of ideas, thought blocking or word salad noted Thought Content: (abnormal/psychotic thoughts): no abnormal or delusional thought process evidenced SI/HI: denies Si/Hi Perception: no illusions or visual/auditory hallucinations noted; no response to internal stimuli demonstrated Mood & Affect: "good"/full range, neutral Judgment & Insight: both fair Attention and Concentration : Good Cognition : WNL Language : Good ADL - Intact    Screenings: GAD-7    Flowsheet Row Video Visit from 04/12/2022 in [place]  Total GAD-7 Score 6      PHQ2-9    Flowsheet Row Video Visit from 04/12/2022 in [place]  PHQ-2 Total Score 2  PHQ-9 Total Score 4        Assessment and Plan:    21 year old female with 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  She appears to be feeling less stressed and that seems to have improved her anxiety as well.  Continues to take Lexapro 7.5 mg daily and therefore recommending to continue.  Referred patient for individual psychotherapy with [facility] therapist.     Plan:   - Reviewed response to current medications - Continue Lexapro 7.5 mg daily, pt declines to increase at this time.  - Continue atarax 12.5-25 mg q8hrs as needed for anxiety - Side effects including but not limited to nausea, vomiting, diarrhea, constipation, headaches, dizziness, black box warning of suicidal thoughts with SSRI were discussed with pt and parents at the initiation.            Orlene Erm, MD 10/27/22, 10:00AM

## 2022-11-24 ENCOUNTER — Telehealth: Payer: No Typology Code available for payment source | Admitting: Child and Adolescent Psychiatry

## 2022-12-21 ENCOUNTER — Telehealth: Payer: No Typology Code available for payment source | Admitting: Child and Adolescent Psychiatry

## 2022-12-26 ENCOUNTER — Telehealth (INDEPENDENT_AMBULATORY_CARE_PROVIDER_SITE_OTHER): Payer: No Typology Code available for payment source | Admitting: Child and Adolescent Psychiatry

## 2022-12-26 DIAGNOSIS — F411 Generalized anxiety disorder: Secondary | ICD-10-CM | POA: Diagnosis not present

## 2022-12-26 MED ORDER — ESCITALOPRAM OXALATE 5 MG PO TABS: 7.5000 mg | ORAL_TABLET | Freq: Every day | ORAL | 2 refills | Status: DC

## 2022-12-26 NOTE — Progress Notes (Signed)
Virtual Visit via Video Note  I connected with Ellen Byrd on 12/26/22  at  1:00 PM EST by a video enabled telemedicine application and verified that I am speaking with the correct person using two identifiers.  Location: Patient: home Provider: office   I discussed the limitations of evaluation and management by telemedicine and the availability of in person appointments. The patient expressed understanding and agreed to proceed.    I discussed the assessment and treatment plan with the patient. The patient was provided an opportunity to ask questions and all were answered. The patient agreed with the plan and demonstrated an understanding of the instructions.   The patient was advised to call back or seek an in-person evaluation if the symptoms worsen or if the condition fails to improve as anticipated.    Ellen Erm, MD   [facility] MD/PA/NP OP Progress Note 12/26/22  10:30AM Ellen Byrd  MRN:  [id number]  Chief Complaint: Medication management follow-up for anxiety.  HPI: This is a 22 year old female, domiciled in between parents, currently at [facility] with no significant medical history and psychiatric history of anxiety was seen and evaluated over telemedicine encounter for follow-up.  Ellen Byrd says that she had a few stressors in the interim since last appointment, one of her friend died in a car accident about 3 weeks ago, she was able to grieve and says that she is able to move on now.  She says that she is back in school, on track to graduate in December, plans to take LSAT this June and apply for law college.  She says that she has been managing her anxiety well, denies any problems with mood.  Has been sick lately with cold and cough, and therefore it has interfered with her sleep, discussed that she can use hydroxyzine.  She says that she has not been taking any cold medicine because of the dextromethorphan's interaction with Lexapro.  We assessed risks and benefits, she  would like to discontinue with Lexapro and she will reach out to PCP if her cough does not get better.  She denies any SI or HI, denies any substance abuse.  Recommending to continue with Lexapro 7.5 mg daily and follow back again in about 6 weeks or early if needed.   Visit Diagnosis:    ICD-10-CM   1. Generalized anxiety disorder  F41.1           Past Psychiatric History:     Inpatient: None RTC: None Outpatient:     - Meds: None    - Therapy: Ellen Byrd since 6th grade and was seeing every 2 weeks. Therapist recently retired and [name] does not feel the need to continue with therapy.  Hx of SI/HI: None reported.  Past Medical History: No past medical history on file. No past surgical history on file.  Family Psychiatric History:    Mother - Anxiety No other family psychiatric hx reported.   Family History: No family history on file.  Social History:  Social History   Socioeconomic History   Marital status: Single    Spouse name: Not on file   Number of children: Not on file   Years of education: Not on file   Highest education level: Not on file  Occupational History   Not on file  Tobacco Use   Smoking status: Not on file   Smokeless tobacco: Not on file  Substance and Sexual Activity   Alcohol use: Not on file   Drug use:  Not on file   Sexual activity: Not on file  Other Topics Concern   Not on file  Social History Narrative   Not on file   Social Determinants of Health   Financial Resource Strain: Not on file  Food Insecurity: Not on file  Transportation Needs: Not on file  Physical Activity: Not on file  Stress: Not on file  Social Connections: Not on file    Allergies: No Known Allergies  Metabolic Disorder Labs: No results found for: "HGBA1C", "MPG" No results found for: "PROLACTIN" No results found for: "CHOL", "TRIG", "HDL", "CHOLHDL", "VLDL", "LDLCALC" No results found for: "TSH"  Therapeutic Level Labs: No results found for:  "LITHIUM" No results found for: "VALPROATE" No results found for: "CBMZ"  Current Medications: Current Outpatient Medications  Medication Sig Dispense Refill   escitalopram (LEXAPRO) 5 MG tablet Take 1.5 tablets (7.5 mg total) by mouth daily. 45 tablet 2   hydrOXYzine (ATARAX/VISTARIL) 25 MG tablet Take 0.5-1 tablets (12.5-25 mg total) by mouth 3 (three) times daily as needed for anxiety. 30 tablet 0   No current facility-administered medications for this visit.     Musculoskeletal: Strength & Muscle Tone: unable to assess since visit was over the telemedicine. Gait & Station: unable to assess since visit was over the telemedicine. Patient leans: N/A  Psychiatric Specialty Exam: Review of Systems  There were no vitals taken for this visit.There is no height or weight on file to calculate BMI.   Mental Status Exam: Appearance: casually dressed; well groomed; no overt signs of trauma or distress noted Attitude: calm, cooperative with good eye contact Activity: No PMA/PMR, no tics/no tremors; no EPS noted  Speech: normal rate, rhythm and volume Thought Process: Logical, linear, and goal-directed.  Associations: no looseness, tangentiality, circumstantiality, flight of ideas, thought blocking or word salad noted Thought Content: (abnormal/psychotic thoughts): no abnormal or delusional thought process evidenced SI/HI: denies Si/Hi Perception: no illusions or visual/auditory hallucinations noted; no response to internal stimuli demonstrated Mood & Affect: "good"/full range, neutral Judgment & Insight: both fair Attention and Concentration : Good Cognition : WNL Language : Good ADL - Intact   Screenings: GAD-7    Flowsheet Row Video Visit from 04/12/2022 in [place]  Total GAD-7 Score 6      PHQ2-9    Flowsheet Row Video Visit from 04/12/2022 in [place]  PHQ-2 Total Score 2  PHQ-9  Total Score 4        Assessment and Plan:   22 year old female with 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  She appears to be managing her anxiety well despite multiple psychosocial stressors, recommending to continue with Lexapro 7.5 mg daily.  Plan as mentioned below.     Plan:   - Reviewed response to current medications - Continue Lexapro 7.5 mg daily, pt declines to increase at this time.  - Continue atarax 12.5-25 mg q8hrs as needed for anxiety - Side effects including but not limited to nausea, vomiting, diarrhea, constipation, headaches, dizziness, black box warning of suicidal thoughts with SSRI were discussed with pt and parents at the initiation.            Ellen Erm, MD 12/26/22, 10:00AM

## 2023-02-16 ENCOUNTER — Telehealth: Payer: No Typology Code available for payment source | Admitting: Child and Adolescent Psychiatry

## 2023-04-04 ENCOUNTER — Telehealth: Payer: No Typology Code available for payment source | Admitting: Child and Adolescent Psychiatry

## 2023-04-04 DIAGNOSIS — F411 Generalized anxiety disorder: Secondary | ICD-10-CM

## 2023-04-04 MED ORDER — ESCITALOPRAM OXALATE 5 MG PO TABS: 7.5000 mg | ORAL_TABLET | Freq: Every day | ORAL | 2 refills | Status: DC

## 2023-04-04 NOTE — Progress Notes (Signed)
Virtual Visit via Video Note  I connected with Mckinley Kmetz on 04/04/23  at  2:00 PM EDT by a video enabled telemedicine application and verified that I am speaking with the correct person using two identifiers.  Location: Patient: home Provider: office   I discussed the limitations of evaluation and management by telemedicine and the availability of in person appointments. The patient expressed understanding and agreed to proceed.    I discussed the assessment and treatment plan with the patient. The patient was provided an opportunity to ask questions and all were answered. The patient agreed with the plan and demonstrated an understanding of the instructions.   The patient was advised to call back or seek an in-person evaluation if the symptoms worsen or if the condition fails to improve as anticipated.    [name], MD   [facility] MD/PA/NP OP Progress Note 04/04/23  2:00PM Azilee Raczkowski  MRN:  [id number]  Chief Complaint: Medication management follow-up for anxiety.  HPI: This is a 22 year old female, domiciled in between parents, currently at [facility] with no significant medical history and psychiatric history of anxiety was seen and evaluated over telemedicine encounter for follow-up.  Donnie reports that she continues to have few stressors in her life, taking 7 classes this summer semester, had challenges in the college campus due to recent events with college protests and her jewish background and was afraid to attend classes in spring semester. Provided refelctive and empathic listening, and validated patient's experience.  She reports that despite the stressors, she has been able to manage her anxiety well.  Feels better since she has been home and doing online classes and only has to do 2 classes in person and fall and then she will graduate.  She denies any problems with mood, denies any low lows, has been sleeping well, denies any problems with appetite or energy, denies  any SI or HI.  She says that she has been consistently taking her Lexapro and recommended to continue.  We discussed to have another follow-up in about 2 months or earlier if needed.   Visit Diagnosis:  No diagnosis found.       Past Psychiatric History:     Inpatient: None RTC: None Outpatient:     - Meds: None    - Therapy: Allena Earing since 6th grade and was seeing every 2 weeks. Therapist recently retired and [place] does not feel the need to continue with therapy.  Hx of SI/HI: None reported.  Past Medical History: No past medical history on file. No past surgical history on file.  Family Psychiatric History:    Mother - Anxiety No other family psychiatric hx reported.   Family History: No family history on file.  Social History:  Social History   Socioeconomic History   Marital status: Single    Spouse name: Not on file   Number of children: Not on file   Years of education: Not on file   Highest education level: Not on file  Occupational History   Not on file  Tobacco Use   Smoking status: Not on file   Smokeless tobacco: Not on file  Substance and Sexual Activity   Alcohol use: Not on file   Drug use: Not on file   Sexual activity: Not on file  Other Topics Concern   Not on file  Social History Narrative   Not on file   Social Determinants of Health   Financial Resource Strain: Not on file  Food Insecurity: Not on file  Transportation Needs: Not on file  Physical Activity: Not on file  Stress: Not on file  Social Connections: Not on file    Allergies: No Known Allergies  Metabolic Disorder Labs: No results found for: "HGBA1C", "MPG" No results found for: "PROLACTIN" No results found for: "CHOL", "TRIG", "HDL", "CHOLHDL", "VLDL", "LDLCALC" No results found for: "TSH"  Therapeutic Level Labs: No results found for: "LITHIUM" No results found for: "VALPROATE" No results found for: "CBMZ"  Current Medications: Current Outpatient  Medications  Medication Sig Dispense Refill   escitalopram (LEXAPRO) 5 MG tablet Take 1.5 tablets (7.5 mg total) by mouth daily. 45 tablet 2   hydrOXYzine (ATARAX/VISTARIL) 25 MG tablet Take 0.5-1 tablets (12.5-25 mg total) by mouth 3 (three) times daily as needed for anxiety. 30 tablet 0   No current facility-administered medications for this visit.     Musculoskeletal: Strength & Muscle Tone: unable to assess since visit was over the telemedicine. Gait & Station: unable to assess since visit was over the telemedicine. Patient leans: N/A  Psychiatric Specialty Exam: Review of Systems  There were no vitals taken for this visit.There is no height or weight on file to calculate BMI.   Mental Status Exam: Appearance: casually dressed; well groomed; no overt signs of trauma or distress noted Attitude: calm, cooperative with good eye contact Activity: No PMA/PMR, no tics/no tremors; no EPS noted  Speech: normal rate, rhythm and volume Thought Process: Logical, linear, and goal-directed.  Associations: no looseness, tangentiality, circumstantiality, flight of ideas, thought blocking or word salad noted Thought Content: (abnormal/psychotic thoughts): no abnormal or delusional thought process evidenced SI/HI: denies Si/Hi Perception: no illusions or visual/auditory hallucinations noted; no response to internal stimuli demonstrated Mood & Affect: "good"/full range, neutral Judgment & Insight: both fair Attention and Concentration : Good Cognition : WNL Language : Good ADL - Intact    Screenings: GAD-7    Flowsheet Row Video Visit from 04/12/2022 in [facility]  Total GAD-7 Score 6      PHQ2-9    Flowsheet Row Video Visit from 04/12/2022 in [facility]  PHQ-2 Total Score 2  PHQ-9 Total Score 4        Assessment and Plan:   22 year old female with Generalized and Social Anxiety Disorders with  Panic attacks.  Reviewed response to current medications and she appears to have continued stability with her anxiety despite multiple psychosocial stressors and reports compliance with her medications.    Plan as mentioned below.     Plan:   - Reviewed response to current medications - Continue Lexapro 7.5 mg daily, pt declines to increase at this time.  - Continue atarax 12.5-25 mg q8hrs as needed for anxiety - Side effects including but not limited to nausea, vomiting, diarrhea, constipation, headaches, dizziness, black box warning of suicidal thoughts with SSRI were discussed with pt and parents at the initiation.    [name], MD 04/04/23, 2:00 PM

## 2023-06-19 ENCOUNTER — Telehealth: Payer: No Typology Code available for payment source | Admitting: Child and Adolescent Psychiatry

## 2023-11-30 ENCOUNTER — Telehealth: Payer: Self-pay | Admitting: Child and Adolescent Psychiatry

## 2023-11-30 DIAGNOSIS — F411 Generalized anxiety disorder: Secondary | ICD-10-CM

## 2023-11-30 MED ORDER — ESCITALOPRAM OXALATE 5 MG PO TABS: 7.5000 mg | ORAL_TABLET | Freq: Every day | ORAL | 2 refills | Status: DC

## 2023-11-30 NOTE — Telephone Encounter (Signed)
Patient called to schedule appointment. It is 12-17-23 for virtual. States a refill is needed on lexapro. Please send to [facility].

## 2023-11-30 NOTE — Telephone Encounter (Signed)
Rx sent 

## 2023-12-03 NOTE — Telephone Encounter (Signed)
left message notifiying pt that rx was sent to pharmacy

## 2023-12-17 ENCOUNTER — Telehealth (INDEPENDENT_AMBULATORY_CARE_PROVIDER_SITE_OTHER): Payer: Self-pay | Admitting: Child and Adolescent Psychiatry

## 2023-12-17 DIAGNOSIS — F411 Generalized anxiety disorder: Secondary | ICD-10-CM

## 2023-12-17 NOTE — Progress Notes (Signed)
Virtual Visit via Video Note  I connected with Ellen Byrd on 12/17/23  at 11:00 AM EST by a video enabled telemedicine application and verified that I am speaking with the correct person using two identifiers.  Location: Patient: home Provider: office   I discussed the limitations of evaluation and management by telemedicine and the availability of in person appointments. The patient expressed understanding and agreed to proceed.    I discussed the assessment and treatment plan with the patient. The patient was provided an opportunity to ask questions and all were answered. The patient agreed with the plan and demonstrated an understanding of the instructions.   The patient was advised to call back or seek an in-person evaluation if the symptoms worsen or if the condition fails to improve as anticipated.    [name], MD   [facility] MD/PA/NP OP Progress Note 12/17/23  11:00 AM Ellen Byrd  MRN:  [id number]  Chief Complaint: Medication management follow-up for anxiety.  HPI: This is a 23 year old female, domiciled in between parents, completed graduation from [employer] with no significant medical history and psychiatric history of anxiety was seen and evaluated over telemedicine encounter for follow-up.  She was last seen about 8 months ago and presented today for follow-up.  She reported that she graduated from [employer] on December 14, prior to that for 2 weeks, she was feeling very stressed, could not sleep more than 4 hours, had a lot of anxiety in the context of all the work she had to finish for her graduation.  She reported that since December 14 she has been doing much better, has occasional anxiety but she is not stressed anymore.  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  She is planning to find a job related to her undergraduate degree.  She currently denied any problems with her mood,  denied problems with sleep or appetite.  She reported that she is now getting back in routine, starting to go back to gym, working on eating healthy, and sleeping well.  She reported that she has been taking her Lexapro consistently now however was not taking it consistently previously.  We discussed the importance of taking medications consistently.  She was receptive to this.  I discussed to continue with Lexapro 7.5 mg daily and follow-up in about 2 months or earlier if needed.  Visit Diagnosis:    ICD-10-CM   1. Generalized anxiety disorder  F41.1            Past Psychiatric History:     Inpatient: None RTC: None Outpatient:     - Meds: None    - Therapy: Allena Earing since 6th grade and was seeing every 2 weeks. Therapist recently retired and Winta does not feel the need to continue with therapy.  Hx of SI/HI: None reported.  Past Medical History: No past medical history on file. No past surgical history on file.  Family Psychiatric History:    Mother - Anxiety No other family psychiatric hx reported.   Family History: No family history on file.  Social History:  Social History   Socioeconomic History   Marital status: Single    Spouse name: Not on file   Number of children: Not on file   Years of education: Not on file   Highest education level: Not on file  Occupational History   Not on file  Tobacco Use  Smoking status: Not on file   Smokeless tobacco: Not on file  Substance and Sexual Activity   Alcohol use: Not on file   Drug use: Not on file   Sexual activity: Not on file  Other Topics Concern   Not on file  Social History Narrative   Not on file   Social Drivers of Health   Financial Resource Strain: Not on file  Food Insecurity: No Food Insecurity (06/28/2021)   Received from [facility] visits prior to 01/13/2023., [facility] visits prior to 01/13/2023.   Hunger Vital Sign    Worried About Patent examiner in the Last Year: Never true    Ran Out of Food in the Last Year: Never true  Transportation Needs: Not on file  Physical Activity: Not on file  Stress: Not on file  Social Connections: Not on file    Allergies: No Known Allergies  Metabolic Disorder Labs: No results found for: "HGBA1C", "MPG" No results found for: "PROLACTIN" No results found for: "CHOL", "TRIG", "HDL", "CHOLHDL", "VLDL", "LDLCALC" No results found for: "TSH"  Therapeutic Level Labs: No results found for: "LITHIUM" No results found for: "VALPROATE" No results found for: "CBMZ"  Current Medications: Current Outpatient Medications  Medication Sig Dispense Refill   escitalopram (LEXAPRO) 5 MG tablet Take 1.5 tablets (7.5 mg total) by mouth daily. 45 tablet 2   hydrOXYzine (ATARAX/VISTARIL) 25 MG tablet Take 0.5-1 tablets (12.5-25 mg total) by mouth 3 (three) times daily as needed for anxiety. 30 tablet 0   No current facility-administered medications for this visit.     Musculoskeletal: Strength & Muscle Tone: unable to assess since visit was over the telemedicine. Gait & Station: unable to assess since visit was over the telemedicine. Patient leans: N/A  Psychiatric Specialty Exam: Review of Systems  There were no vitals taken for this visit.There is no height or weight on file to calculate BMI.   Mental Status Exam: Appearance: casually dressed; well groomed; no overt signs of trauma or distress noted Attitude: calm, cooperative with good eye contact Activity: No PMA/PMR, no tics/no tremors; no EPS noted  Speech: normal rate, rhythm and volume Thought Process: Logical, linear, and goal-directed.  Associations: no looseness, tangentiality, circumstantiality, flight of ideas, thought blocking or word salad noted Thought Content: (abnormal/psychotic thoughts): no abnormal or delusional thought process evidenced SI/HI: denies Si/Hi Perception: no illusions or visual/auditory hallucinations noted;  no response to internal stimuli demonstrated Mood & Affect: "good"/full range, neutral Judgment & Insight: both fair Attention and Concentration : Good Cognition : WNL Language : Good ADL - Intact    Screenings: GAD-7    Flowsheet Row Video Visit [facility]12/20[facility] in [facility]  Total GAD-7 Score 6      PHQ2-9    Flowsheet Row Video Visit from 04/12/2022 in [facility]  PHQ-2 Total Score 2  PHQ-9 Total Score 4        Assessment and Plan:   23 year old female with 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  Reviewed response to her current medications and she appears to have stability with her anxiety at this time.  Her anxiety appeared to have worsened during the time when she was finishing her class work for her graduation.  This seems to have improved after graduation.  Psychoeducation was provided to improve the medication adherence.    Plan as mentioned below.     Plan:   -  Reviewed response to current medications - Continue Lexapro 7.5 mg daily, pt declines to increase at this time.  - Continue atarax 12.5-25 mg q8hrs as needed for anxiety - Side effects including but not limited to nausea, vomiting, diarrhea, constipation, headaches, dizziness, black box warning of suicidal thoughts with SSRI were discussed with pt and parents at the initiation.    [name], MD 12/17/23, 11:30 AM

## 2024-02-25 ENCOUNTER — Telehealth (INDEPENDENT_AMBULATORY_CARE_PROVIDER_SITE_OTHER): Payer: Self-pay | Admitting: Child and Adolescent Psychiatry

## 2024-02-25 DIAGNOSIS — F411 Generalized anxiety disorder: Secondary | ICD-10-CM | POA: Diagnosis not present

## 2024-02-25 NOTE — Progress Notes (Signed)
 Virtual Visit via Video Note  I connected with Ellen Byrd on 02/25/24  at 11:30 AM EDT by a video enabled telemedicine application and verified that I am speaking with the correct person using two identifiers.  Location: Patient: home Provider: office   I discussed the limitations of evaluation and management by telemedicine and the availability of in person appointments. The patient expressed understanding and agreed to proceed.    I discussed the assessment and treatment plan with the patient. The patient was provided an opportunity to ask questions and all were answered. The patient agreed with the plan and demonstrated an understanding of the instructions.   The patient was advised to call back or seek an in-person evaluation if the symptoms worsen or if the condition fails to improve as anticipated.    Ellen Smalling, MD   [facility] MD/PA/NP OP Progress Note 02/25/24  11:00 AM Ellen Byrd  MRN:  [id number]  Chief Complaint: Medication management for her anxiety.  HPI: This is a 23 year old female, domiciled in between parents, completed graduation from [employer] with no significant medical history and psychiatric history of anxiety was seen and evaluated over telemedicine encounter for follow-up.    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  She reported that she has been feeling much better since she has been living with her mother, denied excessive worries or anxiety.  Denied any problems with her mood.  Denied low lows or depressive episodes.  She denied SI or HI.  She reported that she has applied for permit to carry weapon, and said that writer may get a call from the permit issuing authority.  Discussed that if I get a call, I will contact her and get her consent to speak with them.  She verbalized understanding.  We discussed to  continue Lexapro 7.5 mg daily and follow-up in about 3 months or earlier if needed.  Visit Diagnosis:    ICD-10-CM   1. Generalized anxiety disorder  F41.1             Past Psychiatric History:     Inpatient: None RTC: None Outpatient:     - Meds: None    - Therapy: Allena Earing since 6th grade and was seeing every 2 weeks. Therapist recently retired and Navil does not feel the need to continue with therapy.  Hx of SI/HI: None reported.  Past Medical History: No past medical history on file. No past surgical history on file.  Family Psychiatric History:    Mother - Anxiety No other family psychiatric hx reported.   Family History: No family history on file.  Social History:  Social History   Socioeconomic History   Marital status: Single    Spouse name: Not on file   Number of children: Not on file   Years of education: Not on file   Highest education level: Not on file  Occupational History   Not on file  Tobacco Use   Smoking status: Not on file   Smokeless tobacco: Not on file  Substance and Sexual Activity   Alcohol use: Not on file   Drug use: Not on file   Sexual activity: Not on file  Other Topics Concern   Not on file  Social History Narrative   Not on file   Social Drivers of Health   Financial Resource Strain:  Not on file  Food Insecurity: No Food Insecurity (06/28/2021)   Received from [facility] visits prior to 01/13/2023., [facility] visits prior to 01/13/2023.   Hunger Vital Sign    Worried About Programme researcher, broadcasting/film/video in the Last Year: Never true    Ran Out of Food in the Last Year: Never true  Transportation Needs: Not on file  Physical Activity: Not on file  Stress: Not on file  Social Connections: Not on file    Allergies: No Known Allergies  Metabolic Disorder Labs: No results found for: "HGBA1C", "MPG" No results found for: "PROLACTIN" No results found for: "CHOL", "TRIG", "HDL",  "CHOLHDL", "VLDL", "LDLCALC" No results found for: "TSH"  Therapeutic Level Labs: No results found for: "LITHIUM" No results found for: "VALPROATE" No results found for: "CBMZ"  Current Medications: Current Outpatient Medications  Medication Sig Dispense Refill   escitalopram (LEXAPRO) 5 MG tablet Take 1.5 tablets (7.5 mg total) by mouth daily. 45 tablet 2   hydrOXYzine (ATARAX/VISTARIL) 25 MG tablet Take 0.5-1 tablets (12.5-25 mg total) by mouth 3 (three) times daily as needed for anxiety. 30 tablet 0   No current facility-administered medications for this visit.     Musculoskeletal: Strength & Muscle Tone: unable to assess since visit was over the telemedicine. Gait & Station: unable to assess since visit was over the telemedicine. Patient leans: N/A  Psychiatric Specialty Exam: Review of Systems  There were no vitals taken for this visit.There is no height or weight on file to calculate BMI.   Mental Status Exam: Appearance: casually dressed; well groomed; no overt signs of trauma or distress noted Attitude: calm, cooperative with good eye contact Activity: No PMA/PMR, no tics/no tremors; no EPS noted  Speech: normal rate, rhythm and volume Thought Process: Logical, linear, and goal-directed.  Associations: no looseness, tangentiality, circumstantiality, flight of ideas, thought blocking or word salad noted Thought Content: (abnormal/psychotic thoughts): no abnormal or delusional thought process evidenced SI/HI: denies Si/Hi Perception: no illusions or visual/auditory hallucinations noted; no response to internal stimuli demonstrated Mood & Affect: "good"/full range, neutral Judgment & Insight: both fair Attention and Concentration : Good Cognition : WNL Language : Good ADL - Intact    Screenings: GAD-7    Flowsheet Row Video Visit from 04/12/2022 in [facility]  Total GAD-7 Score 6      PHQ2-9    Flowsheet Row Video  Visit from 04/12/2022 in [facility]  PHQ-2 Total Score 2  PHQ-9 Total Score 4        Assessment and Plan:   23 year old female with 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  Reviewed response to her current medications and she appears to have continued stability with anxiety, recommending to continue with Lexapro 7.5 mg daily.    Plan as mentioned below.     Plan:   - Reviewed response to current medications - Continue Lexapro 7.5 mg daily, pt declines to increase at this time.  - Continue atarax 12.5-25 mg q8hrs as needed for anxiety - Side effects including but not limited to nausea, vomiting, diarrhea, constipation, headaches, dizziness, black box warning of suicidal thoughts with SSRI were discussed with pt and parents at the initiation.    Ellen Smalling, MD 02/25/24, 11:30 AM

## 2024-04-05 ENCOUNTER — Other Ambulatory Visit: Payer: Self-pay | Admitting: Child and Adolescent Psychiatry

## 2024-04-05 DIAGNOSIS — F411 Generalized anxiety disorder: Secondary | ICD-10-CM

## 2024-05-29 ENCOUNTER — Telehealth: Admitting: Child and Adolescent Psychiatry

## 2024-06-02 ENCOUNTER — Telehealth: Admitting: Child and Adolescent Psychiatry

## 2024-09-16 ENCOUNTER — Other Ambulatory Visit: Payer: Self-pay | Admitting: Child and Adolescent Psychiatry

## 2024-09-16 DIAGNOSIS — F411 Generalized anxiety disorder: Secondary | ICD-10-CM

## 2024-11-10 ENCOUNTER — Other Ambulatory Visit: Payer: Self-pay | Admitting: Child and Adolescent Psychiatry

## 2024-11-10 DIAGNOSIS — F411 Generalized anxiety disorder: Secondary | ICD-10-CM
